# Patient Record
Sex: Female | Born: 1965 | Hispanic: No | Marital: Married | Smoking: Former smoker
Health system: Southern US, Community
[De-identification: ages and names within clinical notes are randomized; demographics above are authoritative.]

## PROBLEM LIST (undated history)

## (undated) DIAGNOSIS — F329 Major depressive disorder, single episode, unspecified: Secondary | ICD-10-CM

## (undated) DIAGNOSIS — F419 Anxiety disorder, unspecified: Secondary | ICD-10-CM

## (undated) DIAGNOSIS — I10 Essential (primary) hypertension: Secondary | ICD-10-CM

## (undated) DIAGNOSIS — F32A Depression, unspecified: Secondary | ICD-10-CM

## (undated) DIAGNOSIS — F17200 Nicotine dependence, unspecified, uncomplicated: Secondary | ICD-10-CM

## (undated) DIAGNOSIS — R7989 Other specified abnormal findings of blood chemistry: Secondary | ICD-10-CM

## (undated) DIAGNOSIS — G4733 Obstructive sleep apnea (adult) (pediatric): Secondary | ICD-10-CM

## (undated) DIAGNOSIS — D219 Benign neoplasm of connective and other soft tissue, unspecified: Secondary | ICD-10-CM

## (undated) DIAGNOSIS — R945 Abnormal results of liver function studies: Secondary | ICD-10-CM

## (undated) HISTORY — PX: DILATION AND CURETTAGE OF UTERUS: SHX78

## (undated) HISTORY — DX: Depression, unspecified: F32.A

## (undated) HISTORY — PX: UTERINE FIBROID SURGERY: SHX826

## (undated) HISTORY — DX: Obstructive sleep apnea (adult) (pediatric): G47.33

## (undated) HISTORY — DX: Major depressive disorder, single episode, unspecified: F32.9

## (undated) HISTORY — DX: Benign neoplasm of connective and other soft tissue, unspecified: D21.9

## (undated) HISTORY — DX: Other specified abnormal findings of blood chemistry: R79.89

## (undated) HISTORY — PX: TUBAL LIGATION: SHX77

## (undated) HISTORY — DX: Abnormal results of liver function studies: R94.5

## (undated) HISTORY — DX: Essential (primary) hypertension: I10

## (undated) HISTORY — DX: Nicotine dependence, unspecified, uncomplicated: F17.200

## (undated) HISTORY — DX: Anxiety disorder, unspecified: F41.9

---

## 2007-10-02 ENCOUNTER — Other Ambulatory Visit: Admission: RE | Admit: 2007-10-02 | Discharge: 2007-10-02 | Payer: Self-pay | Admitting: Gynecology

## 2007-11-20 ENCOUNTER — Encounter: Payer: Self-pay | Admitting: Gynecology

## 2007-11-20 ENCOUNTER — Ambulatory Visit (HOSPITAL_BASED_OUTPATIENT_CLINIC_OR_DEPARTMENT_OTHER): Admission: RE | Admit: 2007-11-20 | Discharge: 2007-11-20 | Payer: Self-pay | Admitting: Gynecology

## 2009-07-26 ENCOUNTER — Other Ambulatory Visit: Admission: RE | Admit: 2009-07-26 | Discharge: 2009-07-26 | Payer: Self-pay | Admitting: Gynecology

## 2009-07-26 ENCOUNTER — Ambulatory Visit: Payer: Self-pay | Admitting: Gynecology

## 2009-07-29 ENCOUNTER — Ambulatory Visit: Payer: Self-pay | Admitting: Gynecology

## 2009-08-22 ENCOUNTER — Ambulatory Visit: Payer: Self-pay | Admitting: Gynecology

## 2009-08-23 HISTORY — PX: HYSTEROSCOPY: SHX211

## 2009-08-26 ENCOUNTER — Ambulatory Visit: Payer: Self-pay | Admitting: Gynecology

## 2009-08-26 ENCOUNTER — Ambulatory Visit (HOSPITAL_BASED_OUTPATIENT_CLINIC_OR_DEPARTMENT_OTHER): Admission: RE | Admit: 2009-08-26 | Discharge: 2009-08-26 | Payer: Self-pay | Admitting: Gynecology

## 2010-11-07 NOTE — Op Note (Signed)
NAMELACEE, GREY                 ACCOUNT NO.:  [id number]   MEDICAL RECORD NO.:  [id number]          PATIENT TYPE:  AMB   LOCATION:  [facility]                         FACILITY:  [facility]   PHYSICIAN:  Timothy P. Fontaine, M.D.DATE OF BIRTH:  09/25/1965   DATE OF PROCEDURE:  11/20/2007  DATE OF DISCHARGE:                               OPERATIVE REPORT   PREOPERATIVE DIAGNOSES:  Menorrhagia, submucous myoma.   POSTOPERATIVE DIAGNOSES:  Menorrhagia, submucous myoma.   PROCEDURES:  Hysteroscopic myomectomy.  Dilatation and curettage.   SURGEON:  Timothy P. Fontaine, M.D.   ANESTHETIC:  General with 1% lidocaine paracervical block.   COMPLICATIONS:  None.   ESTIMATED BLOOD LOSS:  Minimal.  Sorbitol discrepancy 110 mL.   SPECIMEN:  1. Endometrial curetting.  2. Submucous myoma fragments   FINDINGS:  EUA, external BUS, vagina normal.  Cervix normal with uterus  grossly normal in size, midline and mobile.  Adnexa without masses.  Hysteroscopic, large submucous myoma emanating from the posterior  lateral left endometrial cavity.  Hysteroscopy was otherwise adequate  and normal, noting right and left tubal ostia, fundus, anterior-  posterior surfaces, lower uterine segment, endocervical canal all  visualized.   PROCEDURE IN DETAIL:  The patient was taken to the operating room,  underwent general anesthesia, was placed in the dorsal lithotomy  position, received a perineal vaginal preparation with Betadine  solution.  Bladder emptied with in-and-out Foley catheterization.  EUA  performed.  The patient draped in the usual fashion.  The cervix was  visualized with a speculum.  Anterior lip grasped with single-tooth  tenaculum and a paracervical block using 1% lidocaine was placed, a  total of 10 mL.  Cervix was gently, gradually dilated to admit the  operative hysteroscope and hysteroscopy was performed with findings  noted above.  Using the right-angle resectoscopic loop, the myoma was  resected in progressive passes.  There was a small amount of myoma  within the wall of the uterus that remained and for fear of perforation  this was left in situ, although it was cauterized with hopes to destroy  any remaining myoma.  Post myomectomy, hysteroscopy showed an empty  cavity, good distention.  No evidence of perforation.  The  instruments were removed.  Tenaculum removed.  Hemostasis visualized.  The patient placed in the supine position, received Toradol  intraoperatively, was awakened without difficulty and was taken to the  recovery room in good condition, having tolerated the procedure well      Timothy P. Fontaine, M.D.  Electronically Signed     TPF/MEDQ  D:  11/20/2007  T:  11/20/2007  Job:  [id number]

## 2010-11-07 NOTE — H&P (Signed)
NAMEJONIQUE, Misty Martin                 ACCOUNT NO.:  [id number]   MEDICAL RECORD NO.:  [id number]          PATIENT TYPE:  AMB   LOCATION:  [facility]                         FACILITY:  [facility]   PHYSICIAN:  Timothy P. Fontaine, M.D.DATE OF BIRTH:  09/13/1965   DATE OF ADMISSION:  11/20/2007  DATE OF DISCHARGE:                              HISTORY & PHYSICAL   DATE/TIME OF ADMISSION:  Nov 20, 2007, at 7:30 for surgery.   CHIEF COMPLAINT:  Menorrhagia.   HISTORY OF PRESENT ILLNESS:  A 45 year old G66, P3 female status post  cesarean section, tubal sterilization, who presents complaining of  progressively worsening menorrhagia.  Sonohysterogram was performed,  which showed a submucous myoma measuring 35 x 38 mm along the posterior  left wall.  The patient is admitted at this time for hysteroscopy,  submucous myoma resection.   PAST SURGICAL HISTORY:  Includes cesarean section x 3, tubal  sterilization.   PAST MEDICAL HISTORY:  None.   CURRENT MEDICATIONS:  None.   ALLERGIES:  No medication allergies.   REVIEW OF SYSTEMS:  Noncontributory.   FAMILY HISTORY:  Noncontributory.   SOCIAL HISTORY:  Noncontributory.   ADMISSION PHYSICAL EXAMINATION:  VITAL SIGNS:  Afebrile.  Vital signs  are stable.  HEENT:  Normal.  LUNGS:  Clear.  CARDIAC:  Regular rate.  No rubs, murmurs or gallops.  ABDOMINAL:  Exam benign.  PELVIC:  External BUS, vagina normal.  Cervix normal.  Uterus grossly  normal in size, midline and mobile, nontender.  Adnexa without masses or  tenderness.   ASSESSMENT:  A 45 year old G32, P3 female, status post cesarean section  x3, tubal sterilization with progressively worsening menorrhagia with  sonohysterogram showing a submucous myoma for hysteroscopic myomectomy,  D&C.  I reviewed the proposed surgery with the patient,  the expected  intraoperative postoperative courses, instrumentation, use of the  resectoscope, D&C.  The patient understands there are no guarantees as  far as menorrhagia relief, and she understands that we may not be able  to resect all of the myoma but only that portion which is protruding  into the cavity.  There may be remaining myoma left that may be an issue  in the future.  The risks of infection, prolonged antibiotics,  hemorrhage necessitating transfusion and the risks of transfusion were  reviewed to include the risks of distending  media absorption leading to  complications such as seizures, comas, were all reviewed.  The risk of  internal organ damage, uterine perforation, bowel-bladder, ureters,  vessels and nerves  necessitating major exploratory reparative surgeries, future reparative  surgeries,  including bowel resection, ostomy formation, bladder repair,  ureteral damage all discussed, understood and accepted.  The patient's  questions were answered to her satisfaction.  She is ready to proceed  with surgery.      Timothy P. Fontaine, M.D.  Electronically Signed     TPF/MEDQ  D:  11/19/2007  T:  11/19/2007  Job:  [id number]

## 2012-04-25 ENCOUNTER — Encounter: Payer: Self-pay | Admitting: Gynecology

## 2012-04-28 ENCOUNTER — Encounter: Payer: Self-pay | Admitting: Gynecology

## 2012-04-28 ENCOUNTER — Ambulatory Visit (INDEPENDENT_AMBULATORY_CARE_PROVIDER_SITE_OTHER): Payer: BC Managed Care – PPO | Admitting: Gynecology

## 2012-04-28 VITALS — BP 120/84 | Ht 64.0 in | Wt 254.0 lb

## 2012-04-28 DIAGNOSIS — F419 Anxiety disorder, unspecified: Secondary | ICD-10-CM | POA: Insufficient documentation

## 2012-04-28 DIAGNOSIS — Z01419 Encounter for gynecological examination (general) (routine) without abnormal findings: Secondary | ICD-10-CM

## 2012-04-28 DIAGNOSIS — F329 Major depressive disorder, single episode, unspecified: Secondary | ICD-10-CM | POA: Insufficient documentation

## 2012-04-28 NOTE — Patient Instructions (Signed)
Follow up in one year for annual exam. Follow up sooner if irregular menses

## 2012-04-28 NOTE — Progress Notes (Signed)
Misty Martin December 10, 1965 [id number]        46 y.o.  [id number] for annual exam.    Past medical history,surgical history, medications, allergies, family history and social history were all reviewed and documented in the EPIC chart. ROS:  Was performed and pertinent positives and negatives are included in the history.  Exam: Kim assistant Filed Vitals:   04/28/12 1523  BP: 120/84  Height: 5\' 4"  (1.626 m)  Weight: 254 lb (115.214 kg)   General appearance  Normal Skin grossly normal Head/Neck normal with no cervical or supraclavicular adenopathy thyroid normal Lungs  clear Cardiac RR, without RMG Abdominal  soft, nontender, without masses, organomegaly or hernia Breasts  examined lying and sitting without masses, retractions, discharge or axillary adenopathy. Pelvic  Ext/BUS/vagina  normal   Cervix  normal   Uterus  anteverted, normal size, shape and contour, midline and mobile nontender   Adnexa  Without masses or tenderness    Anus and perineum  normal   Rectovaginal  normal sphincter tone without palpated masses or tenderness.    Assessment/Plan:  46 y.o. [id number] female for annual exam, status post BTL.   1. Mild menstrual irregularity. Patient notes the last several months that her periods have been a little irregular where she will skip a month and then have a regular period. Has been going through a lot of stress. Does have a history of hysteroscopic myomectomy 08/2009. No other changes to include skin hair or weight nipple discharge. Will keep menstrual calendar as long as relatively regular monthly your occasional skips them we'll monitor. If prolonged period typical bleeding or has more than one month without a menses she knows to call. 2. Pap smear. No Pap smear done today. Last Pap smear 2011. No history of significant abnormalities. We'll plan every 3-5 year Pap smears. 3. Mammography. Patient had within this past year. Will repeat this coming spring.  SBE monthly  reviewed. 4. Health maintenance. No lab work done as it is all done through her primary physician's office. Follow up one year, sooner as needed.    Dara Lords MD, 4:14 PM 04/28/2012

## 2012-04-29 ENCOUNTER — Encounter: Payer: Self-pay | Admitting: Gynecology

## 2012-08-27 ENCOUNTER — Encounter: Payer: Self-pay | Admitting: Gynecology

## 2013-03-13 ENCOUNTER — Encounter: Payer: Self-pay | Admitting: Neurology

## 2013-03-13 ENCOUNTER — Ambulatory Visit (INDEPENDENT_AMBULATORY_CARE_PROVIDER_SITE_OTHER): Payer: BC Managed Care – PPO | Admitting: Neurology

## 2013-03-13 VITALS — BP 129/82 | HR 64 | Ht 64.0 in | Wt 254.0 lb

## 2013-03-13 DIAGNOSIS — R0609 Other forms of dyspnea: Secondary | ICD-10-CM

## 2013-03-13 DIAGNOSIS — R0683 Snoring: Secondary | ICD-10-CM | POA: Insufficient documentation

## 2013-03-13 DIAGNOSIS — G4733 Obstructive sleep apnea (adult) (pediatric): Secondary | ICD-10-CM

## 2013-03-13 NOTE — Progress Notes (Signed)
[facility]  Provider:  Melvyn Novas, M D  Referring Provider: Kari Baars, MD Primary Care Physician:  Misty Baars, MD  Chief Complaint  Patient presents with  . New Evaluation    OSA,Shaw, rm 11    HPI:  Misty Martin is a 47 y.o. female  Is seen here as a referral/ revisit  from Dr. Clelia Croft for a sleep consult.   Misty Martin, a 47 year old Caucasian right handed  female patient, is  seen here today to be reevaluated for possible sleep disordered breathing and  treatment. She had been diagnosed with obstructive sleep apnea about 6 years ago and used CPAP ,with a FFM for about one year, before she stopped using it. The study was performed at [facility] sleep,  Air leaks were irritating her eyes, and she developed a dry cornea with abrasion.   The patient just recently implemented new  sleep routines, she now goes to bed between 8 and 9 PM, it may take up to 30 minutes to fall asleep.  She tends to wake up at 4:00 AM before she has to work. She will have only one bathroom break on average, and she will have at least for 5 uninterrupted hours of sleep. She makes an effort to sleep at least 7-8 hours at night,  does not nap in daytime at all. She has never been " a napper", but her husband has over the last 6 month  observed again loud snores (very loudly) and he has also witnessed some apneas , which  concerned him. The patient reports her husband recently started a daytime job and until July 2013  had been napping in daytime, watching TV at night, eating and drinking both. Now for about a year she has implemented fairly normal sleep habits and has obtained the same bedtimes as his wife. Both sleep better.   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   The patient has gained weight since her last sleep study and has stopped smoking  In January after 38 years. She used to some 1.5 packs a day.  She is exercising  , participates in weight watcher's, stopped drinking alcohol.  She wakes with a dry, parched mouth , fatigued and sleepy.   She felt in retrospective better  when using CPAP 5 years ago,  better rested, less EDS.    Review of Systems: Out of a complete 14 system review, the patient complains of only the following symptoms, and all other reviewed systems are negative. The patient endorsed the Epworth Sleepiness Scale at 7 points, the fatigue severity scale of 28 points, anxiety, obesity,  Ankle edema.   History   Social History  . Marital Status: Married    Spouse Name: Misty Martin    Number of Children: 3  . Years of Education: 2.5 yr col   Occupational History  .  [employer]   Social History Main Topics  . Smoking status: Former Smoker -- 0.50 packs/day    Types: Cigarettes  . Smokeless tobacco: Never Used     Comment: quit 06/2012  . Alcohol Use: 3.0 oz/week    6 drink(s) per week     Comment: one six pack two days weekly  . Drug Use: No  . Sexual Activity: Yes    Birth Control/ Protection: Surgical   Other Topics Concern  . Not on file   Social History Narrative  .  No narrative on file    Family History  Problem Relation Age of Onset  . Heart disease Father   . Alcohol abuse Father   . CVA Father   . Stroke Father   . Uterine cancer Mother   . Deep vein thrombosis Mother   . Hypertension Mother   . Cancer Mother     UTERINE  . Cancer Son     Brain tumor  . Addison's disease Maternal Grandmother   . Alzheimer's disease Maternal Grandmother   . Addison's disease Paternal Grandmother   . Alzheimer's disease Paternal Grandmother   . COPD Paternal Grandfather     Past Medical History  Diagnosis Date  . Fibroid   . Anxiety   . Depression   . OSA (obstructive sleep apnea)     PREVIOUSLY  ON CPAP  . LFT elevation   . Nicotine addiction     quit 2014    Past Surgical History  Procedure Laterality Date  . Cesarean section      x3  . Tubal  ligation    . Dilation and curettage of uterus    . Hysteroscopy  08/2009    Myomectomy  . Uterine fibroid surgery  3/11,3/08    X2    Current Outpatient Prescriptions  Medication Sig Dispense Refill  . Cetirizine HCl (ZYRTEC ALLERGY PO) Take by mouth.      . FIBER PO Take by mouth. 2-3 times daily      . Multiple Vitamin (MULTIVITAMIN) tablet Take 1 tablet by mouth daily.       No current facility-administered medications for this visit.    Allergies as of 03/13/2013 - Review Complete 03/13/2013  Allergen Reaction Noted  . Pollen extract  03/13/2013    Vitals: BP 129/82  Pulse 64  Ht 5\' 4"  (1.626 m)  Wt 254 lb (115.214 kg)  BMI 43.58 kg/m2 Last Weight:  Wt Readings from Last 1 Encounters:  03/13/13 254 lb (115.214 kg)   Last Height:   Ht Readings from Last 1 Encounters:  03/13/13 5\' 4"  (1.626 m)    Physical exam:  General: The patient is awake, alert and appears not in acute distress. The patient is well groomed. Head: Normocephalic, atraumatic. Neck is supple. Mallampatti 3  neck circumference: 16 , no retrognathia, no TMJ, tender , tense paraspinal muscles cervically.  Cardiovascular:  Regular rate and rhythm , without  murmurs or carotid bruit, and without distended neck veins. Respiratory: Lungs are clear to auscultation. Skin:  With evidence of  Ankle-edema, no  Rash, tattooes.  Trunk: BMI iselevated , the patient  has normal posture.  Neurologic exam : The patient is awake and alert, oriented to place and time.  Memory subjective  described as intact.  There is a normal attention span & concentration ability. Speech is fluent without  dysarthria, dysphonia or aphasia. Mood and affect are appropriate.  Cranial nerves: Pupils are equal and briskly reactive to light. Funduscopic exam without  evidence of pallor or edema. Extraocular movements  in vertical and horizontal planes intact and without nystagmus. Visual fields by finger perimetry are intact. Hearing to  finger rub intact.  Facial sensation intact to fine touch. Facial motor strength is symmetric and tongue and uvula move midline.  Motor exam:  Normal tone and normal muscle bulk and symmetric normal strength in all extremities.  Sensory:  Fine touch, pinprick and vibration were tested in all extremities. Proprioception is  normal.  Coordination: Rapid alternating movements in the  fingers/hands is tested and normal. Finger-to-nose maneuver tested and normal without evidence of ataxia, dysmetria or tremor.  Gait and station: Patient walks without assistive device , wide based - Strength within normal limits. Stance is stable and normal. Tandem gait is normal.  Deep tendon reflexes: in the  upper and lower extremities are symmetric and intact. Babinski maneuver response is  downgoing.   Assessment:  After physical and neurologic examination, review of laboratory studies and pre-existing records, assessment is that of a patient with morbid obesity , tension Cervicalgia,  Witnessed snoring and apneas.  Previously diagnosed with  OSA .  Non compliant with CPAP after eye irritation, [facility] was DME at the time, she had poor follow up.    Plan:  Treatment plan and additional workup :  SPLIT study . HTN patient , not diabetic, morbidly obese.   long history of smoking, but not longer smoking.  Co2 needed, AHI of 15 and  Score at 3%,  She has still a CPAP machine , that may be able to be reset. No FFM,  please.  Diet and exercise regimen discussed , main risk factor of  for OSA is her BMI.   Visit in sleep clinic for 50 minutes with education and information about OSA , risk factors and CAD/CVA and sleepiness consequences .

## 2013-03-13 NOTE — Patient Instructions (Signed)
Sleep Apnea  Sleep apnea is a sleep disorder characterized by abnormal pauses in breathing while you sleep. When your breathing pauses, the level of oxygen in your blood decreases. This causes you to move out of deep sleep and into light sleep. As a result, your quality of sleep is poor, and the system that carries your blood throughout your body (cardiovascular system) experiences stress. If sleep apnea remains untreated, the following conditions can develop:  High blood pressure (hypertension).  Coronary artery disease.  Inability to achieve or maintain an erection (impotence).  Impairment of your thought process (cognitive dysfunction). There are three types of sleep apnea: 1. Obstructive sleep apnea Pauses in breathing during sleep because of a blocked airway. 2. Central sleep apnea Pauses in breathing during sleep because the area of the brain that controls your breathing does not send the correct signals to the muscles that control breathing. 3. Mixed sleep apnea A combination of both obstructive and central sleep apnea. RISK FACTORS The following risk factors can increase your risk of developing sleep apnea:  Being overweight.  Smoking.  Having narrow passages in your nose and throat.  Being of older age.  Being female.  Alcohol use.  Sedative and tranquilizer use.  Ethnicity. Among individuals younger than 35 years, African Americans are at increased risk of sleep apnea. SYMPTOMS   Difficulty staying asleep.  Daytime sleepiness and fatigue.  Loss of energy.  Irritability.  Loud, heavy snoring.  Morning headaches.  Trouble concentrating.  Forgetfulness.  Decreased interest in sex. DIAGNOSIS  In order to diagnose sleep apnea, your caregiver will perform a physical examination. Your caregiver may suggest that you take a home sleep test. Your caregiver may also recommend that you spend the night in a sleep lab. In the sleep lab, several monitors record  information about your heart, lungs, and brain while you sleep. Your leg and arm movements and blood oxygen level are also recorded. TREATMENT The following actions may help to resolve mild sleep apnea:  Sleeping on your side.   Using a decongestant if you have nasal congestion.   Avoiding the use of depressants, including alcohol, sedatives, and narcotics.   Losing weight and modifying your diet if you are overweight. There also are devices and treatments to help open your airway:  Oral appliances. These are custom-made mouthpieces that shift your lower jaw forward and slightly open your bite. This opens your airway.  Devices that create positive airway pressure. This positive pressure "splints" your airway open to help you breathe better during sleep. The following devices create positive airway pressure:  Continuous positive airway pressure (CPAP) device. The CPAP device creates a continuous level of air pressure with an air pump. The air is delivered to your airway through a mask while you sleep. This continuous pressure keeps your airway open.  Nasal expiratory positive airway pressure (EPAP) device. The EPAP device creates positive air pressure as you exhale. The device consists of single-use valves, which are inserted into each nostril and held in place by adhesive. The valves create very little resistance when you inhale but create much more resistance when you exhale. That increased resistance creates the positive airway pressure. This positive pressure while you exhale keeps your airway open, making it easier to breath when you inhale again.  Bilevel positive airway pressure (BPAP) device. The BPAP device is used mainly in patients with central sleep apnea. This device is similar to the CPAP device because it also uses an air pump to deliver  continuous air pressure through a mask. However, with the BPAP machine, the pressure is set at two different levels. The pressure when you  exhale is lower than the pressure when you inhale.  Surgery. Typically, surgery is only done if you cannot comply with less invasive treatments or if the less invasive treatments do not improve your condition. Surgery involves removing excess tissue in your airway to create a wider passage way. Document Released: 06/01/2002 Document Revised: 12/11/2011 Document Reviewed: 10/18/2011 [facility] Patient Information 2014 [place], [place]. Chronic Obstructive Pulmonary Disease Chronic obstructive pulmonary disease (COPD) is a lung disease. The lungs become damaged. This makes it hard to get air in and out of your lungs. The damage to your lungs cannot be changed. There are things you can do to improve the lungs and make you feel better. HOME CARE  Take all medicines as told by your doctor.  Use medicines that you breathe in (inhale) as told by your doctor.  Avoid medicines or cough syrups that dry up your airway (antihistamines) and do not allow you to get rid of thick spit.  If you smoke, stop.  Avoid being around smoke, chemicals, and fumes that bother your breathing.  Avoid people that have a catchy (contagious) sickness.  Avoid going outside when it is very hot, cold, or humid.  Use humidifiers in your home and near your bedside if it helps your breathing.  Drink enough fluids to keep your pee (urine) clear or pale yellow.  Eat healthy foods. Eat smaller meals more often and rest before meals.  Ask your doctor if it is okay to take vitamins or pills with minerals (supplements).  Exercise and stay active.  Rest with activity.  Get into a comfortable position when you have trouble breathing.  Learn and use tips on how to relax.  Learn and use tips on how to control your breathing as told by your doctor. Try:  Breathing in through your nose for 1 second. Then, breath out (exhale) through your puckered (like a whistle) lips for 2 seconds.  Putting one hand on your belly (abdomen).  Breathe in slowly through your nose. Your hand on your belly should move out. Breathe out slowly through your puckered lips. Your hand on your belly should move in as you breathe out.  Learn and use controlled coughing to clear thick spit from your lungs. 1. Lean your head slightly forward. 2. Breathe in deeply. 3. Try to hold your breath for 3 seconds. 4. Keep your mouth slightly open while coughing 2 times. 5. Spit any thick spit out into a tissue. 6. Rest and do the steps again 1 or 2 times as needed.  Get all shots (vaccines) a told by your doctor.  Learn how to manage stress.  Schedule and go to all follow-up doctor visits.  Go to therapy that can help you improve your lungs (pulmonary rehabilitation) as told by your doctor.  Use oxygen at home as told by your doctor. GET HELP RIGHT AWAY IF:   You can feel your heart beating really fast.  You have shortness of breath while resting.  You have shortness of breath that stops you from being able to talk.  You have shortness of breath that stops you from doing normal activities.  You have chest pain lasting longer than 5 minutes.  You start to shake uncontrollably (seizure).  Your family or friends notice that you are flustered or confused.  You cough up more thick spit than usual.  There is a change in the color or thickness of the spit.  Breathing is more difficult than usual.  Your breathing is faster than usual.  Your skin color is more blueish than usual.  You are running out of the medicine you take for breathing.  You are anxious, uneasy, fearful, or restless.  You have a fever. MAKE SURE YOU:   Understand these instructions.  Will watch your condition.  Will get help right away if you are not doing well or get worse. Document Released: 11/28/2007 Document Revised: 05/28/2012 Document Reviewed: 08/11/2010 [facility] Patient Information 2014 [place], [place]. Exercise to Lose Weight Exercise and a  healthy diet may help you lose weight. Your doctor may suggest specific exercises. EXERCISE IDEAS AND TIPS  Choose low-cost things you enjoy doing, such as walking, bicycling, or exercising to workout videos.  Take stairs instead of the elevator.  Walk during your lunch break.  Park your car further away from work or school.  Go to a gym or an exercise class.  Start with 5 to 10 minutes of exercise each day. Build up to 30 minutes of exercise 4 to 6 days a week.  Wear shoes with good support and comfortable clothes.  Stretch before and after working out.  Work out until you breathe harder and your heart beats faster.  Drink extra water when you exercise.  Do not do so much that you hurt yourself, feel dizzy, or get very short of breath. Exercises that burn about 150 calories:  Running 1  miles in 15 minutes.  Playing volleyball for 45 to 60 minutes.  Washing and waxing a car for 45 to 60 minutes.  Playing touch football for 45 minutes.  Walking 1  miles in 35 minutes.  Pushing a stroller 1  miles in 30 minutes.  Playing basketball for 30 minutes.  Raking leaves for 30 minutes.  Bicycling 5 miles in 30 minutes.  Walking 2 miles in 30 minutes.  Dancing for 30 minutes.  Shoveling snow for 15 minutes.  Swimming laps for 20 minutes.  Walking up stairs for 15 minutes.  Bicycling 4 miles in 15 minutes.  Gardening for 30 to 45 minutes.  Jumping rope for 15 minutes.  Washing windows or floors for 45 to 60 minutes. Document Released: 07/14/2010 Document Revised: 09/03/2011 Document Reviewed: 07/14/2010 [facility] Patient Information 2014 [place], [place].

## 2013-05-01 ENCOUNTER — Encounter (INDEPENDENT_AMBULATORY_CARE_PROVIDER_SITE_OTHER): Payer: Self-pay

## 2013-05-01 DIAGNOSIS — R0683 Snoring: Secondary | ICD-10-CM

## 2013-05-01 DIAGNOSIS — Z0289 Encounter for other administrative examinations: Secondary | ICD-10-CM

## 2013-05-01 DIAGNOSIS — G4733 Obstructive sleep apnea (adult) (pediatric): Secondary | ICD-10-CM

## 2014-04-26 ENCOUNTER — Encounter: Payer: Self-pay | Admitting: Neurology

## 2015-05-24 ENCOUNTER — Encounter (HOSPITAL_COMMUNITY): Payer: Self-pay | Admitting: *Deleted

## 2015-05-24 DIAGNOSIS — R0602 Shortness of breath: Secondary | ICD-10-CM | POA: Diagnosis not present

## 2015-05-24 DIAGNOSIS — F419 Anxiety disorder, unspecified: Secondary | ICD-10-CM | POA: Insufficient documentation

## 2015-05-24 DIAGNOSIS — R030 Elevated blood-pressure reading, without diagnosis of hypertension: Secondary | ICD-10-CM | POA: Diagnosis not present

## 2015-05-24 DIAGNOSIS — F329 Major depressive disorder, single episode, unspecified: Secondary | ICD-10-CM | POA: Insufficient documentation

## 2015-05-24 DIAGNOSIS — R0789 Other chest pain: Principal | ICD-10-CM | POA: Insufficient documentation

## 2015-05-24 DIAGNOSIS — F17210 Nicotine dependence, cigarettes, uncomplicated: Secondary | ICD-10-CM | POA: Insufficient documentation

## 2015-05-24 DIAGNOSIS — G4733 Obstructive sleep apnea (adult) (pediatric): Secondary | ICD-10-CM | POA: Insufficient documentation

## 2015-05-24 DIAGNOSIS — Z9119 Patient's noncompliance with other medical treatment and regimen: Secondary | ICD-10-CM | POA: Insufficient documentation

## 2015-05-24 DIAGNOSIS — Z6841 Body Mass Index (BMI) 40.0 and over, adult: Secondary | ICD-10-CM | POA: Insufficient documentation

## 2015-05-24 DIAGNOSIS — R079 Chest pain, unspecified: Secondary | ICD-10-CM | POA: Diagnosis present

## 2015-05-24 LAB — BASIC METABOLIC PANEL
ANION GAP: 7 (ref 5–15)
BUN: 8 mg/dL (ref 6–20)
CHLORIDE: 103 mmol/L (ref 101–111)
CO2: 31 mmol/L (ref 22–32)
CREATININE: 0.71 mg/dL (ref 0.44–1.00)
Calcium: 9.4 mg/dL (ref 8.9–10.3)
GFR calc non Af Amer: >60 mL/min (ref >60)
Glucose, Bld: 110 mg/dL — ABNORMAL HIGH (ref 65–99)
POTASSIUM: 4.6 mmol/L (ref 3.5–5.1)
SODIUM: 141 mmol/L (ref 135–145)

## 2015-05-24 LAB — CBC
HEMATOCRIT: 44.1 % (ref 36.0–46.0)
HEMOGLOBIN: 14.7 g/dL (ref 12.0–15.0)
MCH: 30.8 pg (ref 26.0–34.0)
MCHC: 33.3 g/dL (ref 30.0–36.0)
MCV: 92.3 fL (ref 78.0–100.0)
PLATELETS: 224 10*3/uL (ref 150–400)
RBC: 4.78 MIL/uL (ref 3.87–5.11)
RDW: 12.8 % (ref 11.5–15.5)
WBC: 7.1 10*3/uL (ref 4.0–10.5)

## 2015-05-24 LAB — I-STAT TROPONIN, ED: Troponin i, poc: 0 ng/mL (ref 0.00–0.08)

## 2015-05-24 NOTE — ED Notes (Signed)
Pt in stating she hasn't been feeling right for the last few days, generalized fatigue, went to an urgent care for a head cold and her BP was elevated, she has been monitoring it at home and it remains elevated, reports headache today

## 2015-05-25 ENCOUNTER — Observation Stay (HOSPITAL_COMMUNITY): Payer: Managed Care, Other (non HMO)

## 2015-05-25 ENCOUNTER — Encounter (HOSPITAL_COMMUNITY): Payer: Managed Care, Other (non HMO)

## 2015-05-25 ENCOUNTER — Emergency Department (HOSPITAL_COMMUNITY): Payer: Managed Care, Other (non HMO)

## 2015-05-25 ENCOUNTER — Observation Stay (HOSPITAL_BASED_OUTPATIENT_CLINIC_OR_DEPARTMENT_OTHER): Payer: Managed Care, Other (non HMO)

## 2015-05-25 ENCOUNTER — Observation Stay (HOSPITAL_COMMUNITY)
Admission: EM | Admit: 2015-05-25 | Discharge: 2015-05-25 | Disposition: A | Discharge status: Home / Self Care | Payer: Managed Care, Other (non HMO) | Attending: Internal Medicine | Admitting: Internal Medicine

## 2015-05-25 ENCOUNTER — Encounter (HOSPITAL_COMMUNITY): Payer: Self-pay | Admitting: Internal Medicine

## 2015-05-25 DIAGNOSIS — IMO0001 Reserved for inherently not codable concepts without codable children: Secondary | ICD-10-CM | POA: Diagnosis present

## 2015-05-25 DIAGNOSIS — R079 Chest pain, unspecified: Secondary | ICD-10-CM

## 2015-05-25 DIAGNOSIS — R030 Elevated blood-pressure reading, without diagnosis of hypertension: Secondary | ICD-10-CM

## 2015-05-25 LAB — NM MYOCAR MULTI W/SPECT W/WALL MOTION / EF
CHL CUP NUCLEAR SDS: 1
CHL CUP NUCLEAR SRS: 1
CHL CUP RESTING HR STRESS: 62 {beats}/min
CSEPED: 0 min
CSEPEDS: 0 s
Estimated workload: 1 METS
LV dias vol: 110 mL
LV sys vol: 46 mL
MPHR: 169 {beats}/min
NUC STRESS TID: 1.33
Peak HR: 88 {beats}/min
Percent HR: 52 %
RATE: 0.13
SSS: 2

## 2015-05-25 LAB — TROPONIN I
Troponin I: <0.03 ng/mL (ref <0.031)
Troponin I: <0.03 ng/mL (ref <0.031)
Troponin I: <0.03 ng/mL (ref <0.031)

## 2015-05-25 MED ORDER — MORPHINE SULFATE (PF) 2 MG/ML IV SOLN: 2.0000 mg | INTRAVENOUS | Status: DC | PRN

## 2015-05-25 MED ORDER — ASPIRIN 325 MG PO TABS
325.0000 mg | ORAL_TABLET | Freq: Once | ORAL | Status: AC
  Administered 2015-05-25: 325 mg via ORAL
  Filled 2015-05-25: qty 1

## 2015-05-25 MED ORDER — REGADENOSON 0.4 MG/5ML IV SOLN
0.4000 mg | Freq: Once | INTRAVENOUS | Status: AC
  Administered 2015-05-25: 0.4 mg via INTRAVENOUS
  Filled 2015-05-25: qty 5

## 2015-05-25 MED ORDER — NITROGLYCERIN 0.4 MG SL SUBL
0.4000 mg | SUBLINGUAL_TABLET | SUBLINGUAL | Status: DC | PRN
  Administered 2015-05-25: 0.4 mg via SUBLINGUAL
  Filled 2015-05-25: qty 1

## 2015-05-25 MED ORDER — ENOXAPARIN SODIUM 40 MG/0.4ML ~~LOC~~ SOLN
40.0000 mg | Freq: Every day | SUBCUTANEOUS | Status: DC
  Administered 2015-05-25: 40 mg via SUBCUTANEOUS
  Filled 2015-05-25: qty 0.4

## 2015-05-25 MED ORDER — HYDRALAZINE HCL 20 MG/ML IJ SOLN: 10.0000 mg | INTRAMUSCULAR | Status: DC | PRN

## 2015-05-25 MED ORDER — ACETAMINOPHEN 325 MG PO TABS: 650.0000 mg | ORAL_TABLET | ORAL | Status: DC | PRN

## 2015-05-25 MED ORDER — ASPIRIN EC 325 MG PO TBEC
325.0000 mg | DELAYED_RELEASE_TABLET | Freq: Every day | ORAL | Status: DC
  Administered 2015-05-25: 325 mg via ORAL
  Filled 2015-05-25: qty 1

## 2015-05-25 MED ORDER — ASPIRIN 325 MG PO TBEC: 325.0000 mg | DELAYED_RELEASE_TABLET | Freq: Every day | ORAL | Status: DC

## 2015-05-25 MED ORDER — LISINOPRIL 10 MG PO TABS
10.0000 mg | ORAL_TABLET | Freq: Every day | ORAL | Status: DC
  Administered 2015-05-25: 10 mg via ORAL
  Filled 2015-05-25: qty 1

## 2015-05-25 MED ORDER — REGADENOSON 0.4 MG/5ML IV SOLN
INTRAVENOUS | Status: AC
  Filled 2015-05-25: qty 5

## 2015-05-25 MED ORDER — LISINOPRIL 10 MG PO TABS: 10.0000 mg | ORAL_TABLET | Freq: Every day | ORAL | Status: DC

## 2015-05-25 MED ORDER — ONDANSETRON HCL 4 MG/2ML IJ SOLN
4.0000 mg | Freq: Four times a day (QID) | INTRAMUSCULAR | Status: DC | PRN
Start: 2015-05-25 — End: 2015-05-25

## 2015-05-25 NOTE — H&P (Signed)
[facility] History and Physical  OMARIANA Martin [id number] DOB: [birth year] DOA: 05/25/2015  Referring physician: Dr. Claudine Mouton. PCP: Marton Redwood, MD  Specialists: None.  Chief Complaint: Elevated blood pressure and chest pressure.  HPI: Misty Martin is a 49 y.o. female with history of OSA noncompliant with sleep apnea, tobacco abuse presents to the ER because of elevated blood pressure. Patient states that since Saturday 4 days ago patient has been having chest discomfort off and on which was present even at rest. Denies any shortness of breath productive cough fever or chills. Patient had gone to her PCP 2 days ago and was found to have elevated blood pressure and was given blood pressure monitor to be monitored home. At home patient found that her blood pressure has been constant and increasing in patient presented to the ER. In the ER patient also has some chest discomfort which was retrosternal pressure-like nonradiating. Patient's blood pressure also was found to be elevated. Chest x-ray was unremarkable. Troponins were negative. EKG was showing T-wave changes in inferior leads with nonspecific ST changes in the anterior leads. Patient was given sublingual nitroglycerin for elevated patient's chest pressure improved. Patient will be admitted for further workup for chest pressure and blood pressure management.   Review of Systems: As presented in the history of presenting illness, rest negative.  Past Medical History  Diagnosis Date  . Fibroid   . Anxiety   . Depression   . OSA (obstructive sleep apnea)     PREVIOUSLY  ON CPAP  . LFT elevation   . Nicotine addiction     quit 2014   Past Surgical History  Procedure Laterality Date  . Cesarean section      x3  . Tubal ligation    . Dilation and curettage of uterus    . Hysteroscopy  08/2009    Myomectomy  . Uterine fibroid surgery  3/11,3/08    X2   Social History:  reports that she has been smoking Cigarettes.  She has  been smoking about 0.50 packs per day. She has never used smokeless tobacco. She reports that she drinks about 3.0 oz of alcohol per week. She reports that she does not use illicit drugs. Where does patient live home. Can patient participate in ADLs? Yes.  Allergies  Allergen Reactions  . Pollen Extract     Nagging headaches,eye swelling    Family History:  Family History  Problem Relation Age of Onset  . Heart disease Father   . Alcohol abuse Father   . CVA Father   . Stroke Father   . Uterine cancer Mother   . Deep vein thrombosis Mother   . Hypertension Mother   . Cancer Mother     UTERINE  . Cancer Son     Brain tumor  . Addison's disease Maternal Grandmother   . Alzheimer's disease Maternal Grandmother   . Addison's disease Paternal Grandmother   . Alzheimer's disease Paternal Grandmother   . COPD Paternal Grandfather       Prior to Admission medications   Medication Sig Start Date End Date Taking? Authorizing Provider  Cetirizine HCl (ZYRTEC ALLERGY PO) Take 1 tablet by mouth daily as needed (allergies).    Yes Historical Provider, MD  Chlorpheniramine-APAP (CORICIDIN) 2-325 MG TABS Take 1 tablet by mouth 2 (two) times daily as needed (allergies).   Yes Historical Provider, MD  dextromethorphan-guaiFENesin (MUCINEX DM) 30-600 MG 12hr tablet Take 1 tablet by mouth 2 (two) times daily as needed  for cough.   Yes Historical Provider, MD  FIBER PO Take 2 tablets by mouth daily. 2-3 times daily   Yes Historical Provider, MD  Multiple Vitamin (MULTIVITAMIN) tablet Take 1 tablet by mouth daily.   Yes Historical Provider, MD    Physical Exam: Filed Vitals:   05/25/15 0030 05/25/15 0115 05/25/15 0130 05/25/15 0215  BP: 170/99 177/91 134/88 135/72  Pulse: 57 57 71 56  Temp:      TempSrc:      Resp: 15 13 19 17   Weight:      SpO2: 97% 99% 96% 95%     General:  Moderately built and nourished.  Eyes: Anicteric no pallor.  ENT: No discharge from the ears eyes nose and  mouth.  Neck: No mass felt. No JVD appreciated.  Cardiovascular: S1 and S2 heard.  Respiratory: No rhonchi or crepitations.  Abdomen: Soft nontender bowel sounds present.  Skin: No rash.  Musculoskeletal: No edema.  Psychiatric: Appears normal.  Neurologic: Alert awake oriented to time place and person. Moves all extremities.  Labs on Admission:  Basic Metabolic Panel:  Recent Labs Lab 05/24/15 2301  NA 141  K 4.6  CL 103  CO2 31  GLUCOSE 110*  BUN 8  CREATININE 0.71  CALCIUM 9.4   Liver Function Tests: No results for input(s): AST, ALT, ALKPHOS, BILITOT, PROT, ALBUMIN in the last 168 hours. No results for input(s): LIPASE, AMYLASE in the last 168 hours. No results for input(s): AMMONIA in the last 168 hours. CBC:  Recent Labs Lab 05/24/15 2301  WBC 7.1  HGB 14.7  HCT 44.1  MCV 92.3  PLT 224   Cardiac Enzymes: No results for input(s): CKTOTAL, CKMB, CKMBINDEX, TROPONINI in the last 168 hours.  BNP (last 3 results) No results for input(s): BNP in the last 8760 hours.  ProBNP (last 3 results) No results for input(s): PROBNP in the last 8760 hours.  CBG: No results for input(s): GLUCAP in the last 168 hours.  Radiological Exams on Admission: Dg Chest 2 View  05/25/2015  CLINICAL DATA:  Chest pain and dyspnea.  Hypertension EXAM: CHEST  2 VIEW COMPARISON:  None. FINDINGS: There is moderate hyperinflation. The lungs are clear. The pulmonary vasculature is normal. There is no pleural effusion. Hilar and mediastinal contours are unremarkable. IMPRESSION: Hyperinflation. Electronically Signed   By: Andreas Newport M.D.   On: 05/25/2015 01:16    EKG: Independently reviewed. Normal sinus rhythm with T-wave changes in the inferior leads and nonspecific ST-T is in the anterior leads.  Assessment/Plan Principal Problem:   Chest pain Active Problems:   Elevated blood pressure   1. Chest pain - given the history of ongoing tobacco abuse, elevated blood  pressure, EKG changes and response to sublingual nitroglycerin we will cycle cardiac markers to rule out ACS. Check 2-D echo. Aspirin. Keep patient nothing by mouth in anticipation of procedures. I have requested cardiology consult. 2. Elevated blood pressure - I have placed patient on when necessary IV hydralazine and patient is also on when necessary sublingual nitroglycerin. Closely monitor blood pressure trends. Patient may need definite antihypertensives if blood pressure continues to remain high. 3. Tobacco abuse - patient advised to quit smoking. 4. History of OSA noncompliant with C Pap.  I have reviewed patient's old charts and labs. I have personally reviewed patient's EKG and chest x-ray.   DVT Prophylaxis Lovenox.  Code Status: DO NOT INTUBATE.  Family Communication: Discussed with patient.  Disposition Plan: Admit for observation.  Aleeya Veitch N. [facility] Pager [phone].  If 7PM-7AM, please contact night-coverage [email or web address] Password [email or web address] 05/25/2015, 2:48 AM

## 2015-05-25 NOTE — Progress Notes (Signed)
Patient admitted after midnight- please see H&P. Suspect an outpatient stress test but will defer to cardiology CE negative Echo read pending Chest pain negative BP improved- added ACE  Eulogio Bear DO

## 2015-05-25 NOTE — ED Provider Notes (Signed)
CSN: [id number]     Arrival date & time 05/24/15  2245 History  By signing my name below, I, Misty Martin, attest that this documentation has been prepared under the direction and in the presence of Everlene Balls, MD . Electronically Signed: Evelene Martin, Scribe. 05/25/2015. 12:39 AM.  Chief Complaint  Patient presents with  . Hypertension    The history is provided by the patient. No language interpreter was used.    HPI Comments:  Misty Martin is a 49 y.o. female who presents to the Emergency Department complaining of elevated BP which she first noticed 2 days ago. She denies h/o HTN. Pt states she saw PCP on 05/23/15 for "allergy/sinus symptoms" and fatigue which she had been experiencing for a few days.  Pt had an elevated BP in office and was advised by PCP to buy BP machine. Her first reading yesterday was 156/75, subsequent readings ranged from [id number] systolic. She states she has also experience chest pressure and r arm numbness.  She has associated SOB.  She denies having these symptoms in the past.  Has never had a cardiac evaluation.  Past Medical History  Diagnosis Date  . Fibroid   . Anxiety   . Depression   . OSA (obstructive sleep apnea)     PREVIOUSLY  ON CPAP  . LFT elevation   . Nicotine addiction     quit 2014   Past Surgical History  Procedure Laterality Date  . Cesarean section      x3  . Tubal ligation    . Dilation and curettage of uterus    . Hysteroscopy  08/2009    Myomectomy  . Uterine fibroid surgery  3/11,3/08    X2   Family History  Problem Relation Age of Onset  . Heart disease Father   . Alcohol abuse Father   . CVA Father   . Stroke Father   . Uterine cancer Mother   . Deep vein thrombosis Mother   . Hypertension Mother   . Cancer Mother     UTERINE  . Cancer Son     Brain tumor  . Addison's disease Maternal Grandmother   . Alzheimer's disease Maternal Grandmother   . Addison's disease Paternal Grandmother   . Alzheimer's disease  Paternal Grandmother   . COPD Paternal Grandfather    Social History  Substance Use Topics  . Smoking status: Former Smoker -- 0.50 packs/day    Types: Cigarettes  . Smokeless tobacco: Never Used     Comment: quit 06/2012  . Alcohol Use: 3.0 oz/week    6 drink(s) per week     Comment: one six pack two days weekly   OB History    Gravida Para Term Preterm AB TAB SAB Ectopic Multiple Living   4 3 3  1     2      Review of Systems  10 systems reviewed and all are negative for acute change except as noted in the HPI.   Allergies  Pollen extract  Home Medications   Prior to Admission medications   Medication Sig Start Date End Date Taking? Authorizing Provider  Cetirizine HCl (ZYRTEC ALLERGY PO) Take by mouth.    Historical Provider, MD  FIBER PO Take by mouth. 2-3 times daily    Historical Provider, MD  Multiple Vitamin (MULTIVITAMIN) tablet Take 1 tablet by mouth daily.    Historical Provider, MD   BP 170/99 mmHg  Pulse 57  Temp(Src) 98 F (36.7 C) (Oral)  Resp 15  Wt 252 lb (114.306 kg)  SpO2 97% Physical Exam  Constitutional: She is oriented to person, place, and time. She appears well-developed and well-nourished. No distress.  HENT:  Head: Normocephalic and atraumatic.  Nose: Nose normal.  Mouth/Throat: Oropharynx is clear and moist. No oropharyngeal exudate.  Eyes: Conjunctivae and EOM are normal. Pupils are equal, round, and reactive to light. No scleral icterus.  Neck: Normal range of motion. Neck supple. No JVD present. No tracheal deviation present. No thyromegaly present.  Cardiovascular: Normal rate, regular rhythm and normal heart sounds.  Exam reveals no gallop and no friction rub.   No murmur heard. Pulmonary/Chest: Effort normal and breath sounds normal. No respiratory distress. She has no wheezes. She exhibits no tenderness.  Abdominal: Soft. Bowel sounds are normal. She exhibits no distension and no mass. There is no tenderness. There is no rebound and  no guarding.  Musculoskeletal: Normal range of motion. She exhibits no edema or tenderness.  Lymphadenopathy:    She has no cervical adenopathy.  Neurological: She is alert and oriented to person, place, and time. No cranial nerve deficit. She exhibits normal muscle tone.  Skin: Skin is warm and dry. No rash noted. No erythema. No pallor.  Nursing note and vitals reviewed.   ED Course  Procedures  DIAGNOSTIC STUDIES:  Oxygen Saturation is 97% on RA, normal by my interpretation.    COORDINATION OF CARE:  12:27 AM Will order ASA. Pt informed of plans to admit. Discussed treatment plan with pt at bedside and pt agreed to plan.  Labs Review Labs Reviewed  BASIC METABOLIC PANEL - Abnormal; Notable for the following:    Glucose, Bld 110 (*)    All other components within normal limits  CBC  I-STAT TROPOININ, ED    Imaging Review Dg Chest 2 View  05/25/2015  CLINICAL DATA:  Chest pain and dyspnea.  Hypertension EXAM: CHEST  2 VIEW COMPARISON:  None. FINDINGS: There is moderate hyperinflation. The lungs are clear. The pulmonary vasculature is normal. There is no pleural effusion. Hilar and mediastinal contours are unremarkable. IMPRESSION: Hyperinflation. Electronically Signed   By: Andreas Newport M.D.   On: 05/25/2015 01:16   I have personally reviewed and evaluated these images and lab results as part of my medical decision-making.   EKG Interpretation   Date/Time:  Tuesday May 24 2015 22:56:10 EST Ventricular Rate:  61 PR Interval:  148 QRS Duration: 82 QT Interval:  408 QTC Calculation: 410 R Axis:   68 Text Interpretation:  Normal sinus rhythm Cannot rule out Anterior infarct  , age undetermined TWI in V3 Abnormal ECG No old tracing to compare  Confirmed by Glynn Octave [id number]) on 05/25/2015 12:34:05 AM      MDM   Final diagnoses:  None   Patient presents to the emergency department for chest pressure with right arm numbness. I have concern for  possible ACS. EKG does show T-wave inversion in V3 with abnormal anterior leads. She was given nitroglycerin for her chest pressure that is ongoing and aspirin. She'll be admitted to hospitalist for further care.   I personally performed the services described in this documentation, which was scribed in my presence. The recorded information has been reviewed and is accurate.       Everlene Balls, MD 05/25/15 [phone]

## 2015-05-25 NOTE — Progress Notes (Signed)
  1-day NST completed without complications. Official read pending by [facility].  Signed, Erma Heritage, PA-C 05/25/2015, 3:41 PM Pager: [phone]

## 2015-05-25 NOTE — Progress Notes (Signed)
Patient given discharge instructions and prescriptions. Verbalized understanding of instructions with now questions or concerns. IV and telemetry removed. Patient left floor with belongings and friend.

## 2015-05-25 NOTE — Discharge Summary (Signed)
Physician Discharge Summary  JAZAYA HASSER [id number] DOB: 01/29/1966 DOA: 05/25/2015  PCP: Marton Redwood, MD  Admit date: 05/25/2015 Discharge date: 05/25/2015  Time spent: 35 minutes  Recommendations for Outpatient Follow-up:  1. BMP 2 week- started on lisinopril for HTN   Discharge Diagnoses:  Principal Problem:   Chest pain Active Problems:   Elevated blood pressure   Pain in the chest   Discharge Condition: improved  Diet recommendation: cardiac  Filed Weights   05/24/15 2251 05/25/15 0257  Weight: 114.306 kg (252 lb) 114.306 kg (252 lb)    History of present illness:  Misty Martin is a 49 y.o. female with history of OSA noncompliant with sleep apnea, tobacco abuse presents to the ER because of elevated blood pressure. Patient states that since Saturday 4 days ago patient has been having chest discomfort off and on which was present even at rest. Denies any shortness of breath productive cough fever or chills. Patient had gone to her PCP 2 days ago and was found to have elevated blood pressure and was given blood pressure monitor to be monitored home. At home patient found that her blood pressure has been constant and increasing in patient presented to the ER. In the ER patient also has some chest discomfort which was retrosternal pressure-like nonradiating. Patient's blood pressure also was found to be elevated. Chest x-ray was unremarkable. Troponins were negative. EKG was showing T-wave changes in inferior leads with nonspecific ST changes in the anterior leads. Patient was given sublingual nitroglycerin for elevated patient's chest pressure improved. Patient will be admitted for further workup for chest pressure and blood pressure management.   Hospital Course:  Chest pain -stress test negative -CE negative  HTN -started lisinopril -repeat BMP 2 weeks  Procedures:  Consultations:  cards  Discharge Exam: Filed Vitals:   05/25/15 1527 05/25/15 1529  BP:  127/90 131/90  Pulse: 82 73  Temp:    Resp:        Discharge Instructions   Discharge Instructions    Diet - low sodium heart healthy    Complete by:  As directed      Discharge instructions    Complete by:  As directed   BMP 2 weeks re: K     Increase activity slowly    Complete by:  As directed           Current Discharge Medication List    START taking these medications   Details  aspirin EC 325 MG EC tablet Take 1 tablet (325 mg total) by mouth daily. Qty: 30 tablet, Refills: 0    lisinopril (PRINIVIL,ZESTRIL) 10 MG tablet Take 1 tablet (10 mg total) by mouth daily. Qty: 30 tablet, Refills: 0      CONTINUE these medications which have NOT CHANGED   Details  Cetirizine HCl (ZYRTEC ALLERGY PO) Take 1 tablet by mouth daily as needed (allergies).     FIBER PO Take 2 tablets by mouth daily. 2-3 times daily    Multiple Vitamin (MULTIVITAMIN) tablet Take 1 tablet by mouth daily.      STOP taking these medications     Chlorpheniramine-APAP (CORICIDIN) 2-325 MG TABS      dextromethorphan-guaiFENesin (MUCINEX DM) 30-600 MG 12hr tablet        Allergies  Allergen Reactions  . Pollen Extract     Nagging headaches,eye swelling   Follow-up Information    Follow up with Marton Redwood, MD In 2 weeks.   Specialty:  Internal Medicine  Contact information:   [address] [place] [address] [phone]        The results of significant diagnostics from this hospitalization (including imaging, microbiology, ancillary and laboratory) are listed below for reference.    Significant Diagnostic Studies: Dg Chest 2 View  05/25/2015  CLINICAL DATA:  Chest pain and dyspnea.  Hypertension EXAM: CHEST  2 VIEW COMPARISON:  None. FINDINGS: There is moderate hyperinflation. The lungs are clear. The pulmonary vasculature is normal. There is no pleural effusion. Hilar and mediastinal contours are unremarkable. IMPRESSION: Hyperinflation. Electronically Signed   By:  Andreas Newport M.D.   On: 05/25/2015 01:16   Nm Myocar Multi W/spect W/wall Motion / Ef  05/25/2015   There was no ST segment deviation noted during stress.  No T wave inversion was noted during stress.  The study is normal.  This is a low risk study.  The left ventricular ejection fraction is normal (55-65%).  Normal resting and stress perfusion EF 58%    Microbiology: No results found for this or any previous visit (from the past 240 hour(s)).   Labs: Basic Metabolic Panel:  Recent Labs Lab 05/24/15 2301  NA 141  K 4.6  CL 103  CO2 31  GLUCOSE 110*  BUN 8  CREATININE 0.71  CALCIUM 9.4   Liver Function Tests: No results for input(s): AST, ALT, ALKPHOS, BILITOT, PROT, ALBUMIN in the last 168 hours. No results for input(s): LIPASE, AMYLASE in the last 168 hours. No results for input(s): AMMONIA in the last 168 hours. CBC:  Recent Labs Lab 05/24/15 2301  WBC 7.1  HGB 14.7  HCT 44.1  MCV 92.3  PLT 224   Cardiac Enzymes:  Recent Labs Lab 05/25/15 0320 05/25/15 0847  TROPONINI <0.03 <0.03   BNP: BNP (last 3 results) No results for input(s): BNP in the last 8760 hours.  ProBNP (last 3 results) No results for input(s): PROBNP in the last 8760 hours.  CBG: No results for input(s): GLUCAP in the last 168 hours.     Signed:  Kyona Chauncey Eliseo Squires  [facility] 05/25/2015, 5:32 PM

## 2015-05-25 NOTE — Consult Note (Signed)
CARDIOLOGY CONSULT NOTE   Patient ID: AMARION MCGLOTHEN MRN: [id number] DOB/AGE: 1965-10-11 49 y.o.  Admit date: 05/25/2015  Primary Physician   Marton Redwood, MD Primary Cardiologist   New (Dr. Radford Pax) Reason for Consultation   Elevated BP and chest pressure  HPI: Misty Martin is a 49 y.o. female with a history of tobacco smoking and OSA noncompliant with CPAP who came to ED last night for evaluation of elevated blood pressure.  The patient had congestion for the past 7 days. Denies fevers or chills. 4 days ago she developed a left-sided upper chest pressure that intermittently radiates to her back. The pain intermittently comes back without any lesion and just within a few minutes. No associated shortness of breath or nausea. She went to see her primary care provider two days ago where her blood pressure was elevated and was given blood pressure monitor to be monitored home. He continued to feel weak over the weekend and yesterday was worse with intermittend dizziness and left upper chest pain. She checked her blood pressure several times yesterday that was 180s to 190s range this her to ED presentation.   Upon arrival to ED her blood pressure was 202/109 with some mild chest discomfort. Both improved on SL nitro. Chest x-ray clear. Troponin x 3 negative. EKG shows normal sinus rhythm with T-wave inversion in lead 3 and nonspecific ST change in anterior leads. No prior EKG to compare. Cardiology is consulted for further management if patient requires ischemic evaluation are not. The patient denies nausea, vomiting, fever, palpitations, shortness of breath, orthopnea, PND, syncope, abdominal pain, hematochezia, melena, lower extremity edema.  Her father has a multiple stroke, first stroke in his 38s - died of heart failure. Mother has hypertension.   Past Medical History  Diagnosis Date  . Fibroid   . Anxiety   . Depression   . OSA (obstructive sleep apnea)     PREVIOUSLY  ON CPAP  . LFT  elevation   . Nicotine addiction     quit 2014     Past Surgical History  Procedure Laterality Date  . Cesarean section      x3  . Tubal ligation    . Dilation and curettage of uterus    . Hysteroscopy  08/2009    Myomectomy  . Uterine fibroid surgery  3/11,3/08    X2    Allergies  Allergen Reactions  . Pollen Extract     Nagging headaches,eye swelling    I have reviewed the patient's current medications . aspirin EC  325 mg Oral Daily  . enoxaparin (LOVENOX) injection  40 mg Subcutaneous Daily  . lisinopril  10 mg Oral Daily     acetaminophen, hydrALAZINE, morphine injection, nitroGLYCERIN, ondansetron (ZOFRAN) IV  Prior to Admission medications   Medication Sig Start Date End Date Taking? Authorizing Provider  Cetirizine HCl (ZYRTEC ALLERGY PO) Take 1 tablet by mouth daily as needed (allergies).    Yes Historical Provider, MD  Chlorpheniramine-APAP (CORICIDIN) 2-325 MG TABS Take 1 tablet by mouth 2 (two) times daily as needed (allergies).   Yes Historical Provider, MD  dextromethorphan-guaiFENesin (MUCINEX DM) 30-600 MG 12hr tablet Take 1 tablet by mouth 2 (two) times daily as needed for cough.   Yes Historical Provider, MD  FIBER PO Take 2 tablets by mouth daily. 2-3 times daily   Yes Historical Provider, MD  Multiple Vitamin (MULTIVITAMIN) tablet Take 1 tablet by mouth daily.   Yes Historical Provider, MD  Social History   Social History  . Marital Status: Married    Spouse Name: Juanda Crumble  . Number of Children: 3  . Years of Education: 2.5 yr col   Occupational History  .  [employer]   Social History Main Topics  . Smoking status: Current Every Day Smoker -- 0.50 packs/day    Types: Cigarettes  . Smokeless tobacco: Never Used     Comment: quit 06/2012  . Alcohol Use: 3.0 oz/week    6 Standard drinks or equivalent per week     Comment: one six pack two days weekly  . Drug Use: No  . Sexual Activity: Yes    Birth Control/ Protection: Surgical     Other Topics Concern  . Not on file   Social History Narrative    Family Status  Relation Status Death Age  . Father Deceased 49's    CVA  . Mother Alive   . Son Deceased 59    glioblastoma   Family History  Problem Relation Age of Onset  . Heart disease Father   . Alcohol abuse Father   . CVA Father   . Stroke Father   . Uterine cancer Mother   . Deep vein thrombosis Mother   . Hypertension Mother   . Cancer Mother     UTERINE  . Cancer Son     Brain tumor  . Addison's disease Maternal Grandmother   . Alzheimer's disease Maternal Grandmother   . Addison's disease Paternal Grandmother   . Alzheimer's disease Paternal Grandmother   . COPD Paternal Grandfather     ROS:  Full 14 point review of systems complete and found to be negative unless listed above.  Physical Exam: Blood pressure 156/63, pulse 55, temperature 97.9 F (36.6 C), temperature source Oral, resp. rate 18, height 5\' 4"  (1.626 m), weight 252 lb (114.306 kg), SpO2 96 %.  General: Well developed, well nourished, obsese female in no acute distress Head: Eyes PERRLA, No xanthomas. Normocephalic and atraumatic, oropharynx without edema or exudate.  Lungs: Resp regular and unlabored, CTA. TTP over left upper chest.  Heart: RRR no s3, s4, or murmurs..   Neck: No carotid bruits. No lymphadenopathy. No JVD. Abdomen: Bowel sounds present, abdomen soft and non-tender without masses or hernias noted. Msk:  No spine or cva tenderness. No weakness, no joint deformities or effusions. Extremities: No clubbing, cyanosis or edema. DP/PT/Radials 2+ and equal bilaterally. Neuro: Alert and oriented X 3. No focal deficits noted. Psych:  Good affect, responds appropriately Skin: No rashes or lesions noted.  Labs:   Lab Results  Component Value Date   WBC 7.1 05/24/2015   HGB 14.7 05/24/2015   HCT 44.1 05/24/2015   MCV 92.3 05/24/2015   PLT 224 05/24/2015   No results for input(s): INR in the last 72 hours.   Recent Labs Lab 05/24/15 2301  NA 141  K 4.6  CL 103  CO2 31  BUN 8  CREATININE 0.71  CALCIUM 9.4  GLUCOSE 110*   No results found for: MG  Recent Labs  05/25/15 0320 05/25/15 0847  TROPONINI <0.03 <0.03    Recent Labs  05/24/15 2330  TROPIPOC 0.00    ECG:   Vent. rate 61 BPM PR interval 148 ms QRS duration 82 ms QT/QTc 408/410 ms P-R-T axes 30 68 15  Radiology:  Dg Chest 2 View  05/25/2015  CLINICAL DATA:  Chest pain and dyspnea.  Hypertension EXAM: CHEST  2 VIEW COMPARISON:  None. FINDINGS: There is moderate hyperinflation. The lungs are clear. The pulmonary vasculature is normal. There is no pleural effusion. Hilar and mediastinal contours are unremarkable. IMPRESSION: Hyperinflation. Electronically Signed   By: Andreas Newport M.D.   On: 05/25/2015 01:16    ASSESSMENT AND PLAN:     1.Chest pain - The patient has both typical and atypical features. Left upper chest pressure with radiation to her left back in a setting of limited blood pressure, ongoing tobacco abuse and morbid obesity. Her pain was responsive to sublingual nitroglycerin. EKG with nonspecific changes. Troponin x 3 negative. X-ray clear - Will discuss plan with M.D. The patient is nothing by mouth.  2. Severely elevated blood pressure. - Upon admission blood pressure was 202/109. Improved with sublingual nitroglycerin. - Continue lisinopril 10 mg daily. Most recent blood pressure reading 156/63.  3. Tobacco abuse - Strongly encouraged smoking cessation. Education given.  4. Morbid obesity - Body mass index is 43.23 kg/(m^2).  - Encouraged lifestyle modification including daily exercise and dietary modification. She states that she has not been followed up with primary care for the past 2 years. Encouraged to establish care with PCP.  SignedLeanor Kail, [place] 05/25/2015, 11:39 AM Pager [phone]  Co-Sign MD

## 2015-05-25 NOTE — ED Notes (Signed)
Pt denies chest pain at this time.

## 2015-05-25 NOTE — ED Notes (Signed)
Patient transported to X-ray 

## 2015-05-25 NOTE — Progress Notes (Signed)
  Echocardiogram 2D Echocardiogram has been performed.  [name] 05/25/2015, 10:15 AM

## 2016-09-17 ENCOUNTER — Other Ambulatory Visit: Payer: Self-pay | Admitting: Internal Medicine

## 2016-09-17 ENCOUNTER — Telehealth (HOSPITAL_COMMUNITY): Payer: Self-pay | Admitting: Internal Medicine

## 2016-09-17 DIAGNOSIS — G453 Amaurosis fugax: Secondary | ICD-10-CM

## 2016-09-17 DIAGNOSIS — R0609 Other forms of dyspnea: Principal | ICD-10-CM

## 2016-09-17 NOTE — Telephone Encounter (Signed)
09/17/2016 10:13 AM Phone (Outgoing) Misty Martin (Self) [phone] (H)   Left Message - Called pt and lmsg for her to CB to sch an echo.     By Verdene Rio

## 2016-09-18 ENCOUNTER — Other Ambulatory Visit (HOSPITAL_COMMUNITY): Payer: Managed Care, Other (non HMO)

## 2016-09-24 ENCOUNTER — Other Ambulatory Visit: Payer: Self-pay

## 2016-09-24 ENCOUNTER — Ambulatory Visit (HOSPITAL_COMMUNITY): Payer: BLUE CROSS/BLUE SHIELD | Attending: Cardiovascular Disease

## 2016-09-24 DIAGNOSIS — G453 Amaurosis fugax: Secondary | ICD-10-CM | POA: Insufficient documentation

## 2016-09-24 DIAGNOSIS — Z87891 Personal history of nicotine dependence: Secondary | ICD-10-CM | POA: Diagnosis not present

## 2016-09-24 DIAGNOSIS — Z6841 Body Mass Index (BMI) 40.0 and over, adult: Secondary | ICD-10-CM | POA: Diagnosis not present

## 2016-09-24 DIAGNOSIS — R0609 Other forms of dyspnea: Secondary | ICD-10-CM | POA: Diagnosis present

## 2016-09-24 DIAGNOSIS — E669 Obesity, unspecified: Secondary | ICD-10-CM | POA: Insufficient documentation

## 2016-11-21 ENCOUNTER — Ambulatory Visit (INDEPENDENT_AMBULATORY_CARE_PROVIDER_SITE_OTHER): Payer: BLUE CROSS/BLUE SHIELD | Admitting: Gynecology

## 2016-11-21 ENCOUNTER — Encounter: Payer: Self-pay | Admitting: Gynecology

## 2016-11-21 VITALS — BP 122/82 | Ht 63.5 in | Wt 258.0 lb

## 2016-11-21 DIAGNOSIS — L723 Sebaceous cyst: Secondary | ICD-10-CM

## 2016-11-21 DIAGNOSIS — N951 Menopausal and female climacteric states: Secondary | ICD-10-CM

## 2016-11-21 DIAGNOSIS — Z1151 Encounter for screening for human papillomavirus (HPV): Secondary | ICD-10-CM | POA: Diagnosis not present

## 2016-11-21 DIAGNOSIS — Z01419 Encounter for gynecological examination (general) (routine) without abnormal findings: Secondary | ICD-10-CM

## 2016-11-21 NOTE — Patient Instructions (Signed)
Schedule your colonoscopy with either:  [facility]   Address: [address], [place], [place] [address]  [phone]    or  [facility]  Address: [address], [place], [place] [address]  [phone]       Call to Schedule your mammogram  Facilities in [place]: 1)  [facility]. [facility], [address] Phone: [phone] 2)  Dr. Bertrand at [facility]  [address] Phone: [phone]     Mammogram A mammogram is an X-ray test to find changes in a woman's breast. You should get a mammogram if:  You are 40 years of age or older  You have risk factors.   Your doctor recommends that you have one.  BEFORE THE TEST  Do not schedule the test the week before your period, especially if your breasts are sore during this time.  On the day of your mammogram:  Wash your breasts and armpits well. After washing, do not put on any deodorant or talcum powder on until after your test.   Eat and drink as you usually do.   Take your medicines as usual.   If you are diabetic and take insulin, make sure you:   Eat before coming for your test.   Take your insulin as usual.   If you cannot keep your appointment, call before the appointment to cancel. Schedule another appointment.  TEST  You will need to undress from the waist up. You will put on a hospital gown.   Your breast will be put on the mammogram machine, and it will press firmly on your breast with a piece of plastic called a compression paddle. This will make your breast flatter so that the machine can X-ray all parts of your breast.   Both breasts will be X-rayed. Each breast will be X-rayed from above and from the side. An X-ray might need to be taken again if the picture is not good enough.   The mammogram will last about 15 to 30 minutes.  AFTER THE TEST Finding out the results of your test Ask when  your test results will be ready. Make sure you get your test results.  Document Released: 09/07/2008 Document Revised: 05/31/2011 Document Reviewed: 09/07/2008 Exit[employer] Patient Information 2012 [employer].   

## 2016-11-21 NOTE — Progress Notes (Signed)
    Misty Martin 06-Oct-1965 [id number]        51 y.o.  [id number] for annual exam.  Has not been in the office since 2013.  Past medical history,surgical history, problem list, medications, allergies, family history and social history were all reviewed and documented as reviewed in the EPIC chart.  ROS:  Performed with pertinent positives and negatives included in the history, assessment and plan.   Additional significant findings :  None   Exam: Caryn Bee assistant Vitals:   11/21/16 1420  BP: 122/82  Weight: 258 lb (117 kg)  Height: 5' 3.5" (1.613 m)   Body mass index is 44.99 kg/m.  General appearance:  Normal affect, orientation and appearance. Skin: Grossly normal excepting 3-4 cm classic sebaceous cyst mid upper back between scapulas. HEENT: Without gross lesions.  No cervical or supraclavicular adenopathy. Thyroid normal.  Lungs:  Clear without wheezing, rales or rhonchi Cardiac: RR, without RMG Abdominal:  Soft, nontender, without masses, guarding, rebound, organomegaly or hernia Breasts:  Examined lying and sitting without masses, retractions, discharge or axillary adenopathy. Pelvic:  Ext, BUS, Vagina: Normal  Cervix: Normal  Uterus: Difficult to palpate due to abdominal girth but no gross masses or tenderness  Adnexa: Without gross masses or tenderness    Anus and perineum: Normal   Rectovaginal: Normal sphincter tone without palpated masses or tenderness.    Assessment/Plan:  51 y.o. [id number] female for annual exam, tubal sterilization.   1. Perimenopausal. Patient is having some hot flushes and sweats. Last menstrual period November 2017 with one other menses that year.  I reviewed the perimenopausal time period with the patient.  Options for treatment to include HRT reviewed. Patient not interested at this point. Will keep menstrual calendar as long as less frequent but regular menses will monitor. If prolonged or atypical bleeding or goes more than one year  without bleeding and then has bleeding she knows to call for further evaluation. 2. Pap smear 2011.  Pap smear/HPV done today. No history of significant abnormal Pap smears previously. 3. Mammography 2014. Recommend screening mammogram now. Names and numbers given and she is going to call and schedule. SBE monthly reviewed. 4. Sebaceous cyst between scapulas. Present for years by her history. Classic in appearance. We'll continue to monitor report any issues or changes. 5. Colonoscopy never. Recommended screening colonoscopy in patient agrees to schedule. Names and numbers provided. 6. Health maintenance. No routine lab work done as patient has appointment to see primary physician and plans that time. Follow up in one year, sooner as needed.   Anastasio Auerbach MD, 2:56 PM 11/21/2016

## 2016-11-21 NOTE — Addendum Note (Signed)
Addended by: Nelva Nay on: 11/21/2016 03:27 PM   Modules accepted: Orders

## 2016-11-22 LAB — PAP IG AND HPV HIGH-RISK: HPV DNA High Risk: NOT DETECTED

## 2017-04-06 IMAGING — DX DG CHEST 2V
2 series · 2 of 2 positions shown · non-contrast
Comparison: None.

CLINICAL DATA: Chest pain and dyspnea.  Hypertension

EXAM:
CHEST  2 VIEW

[chest pa]
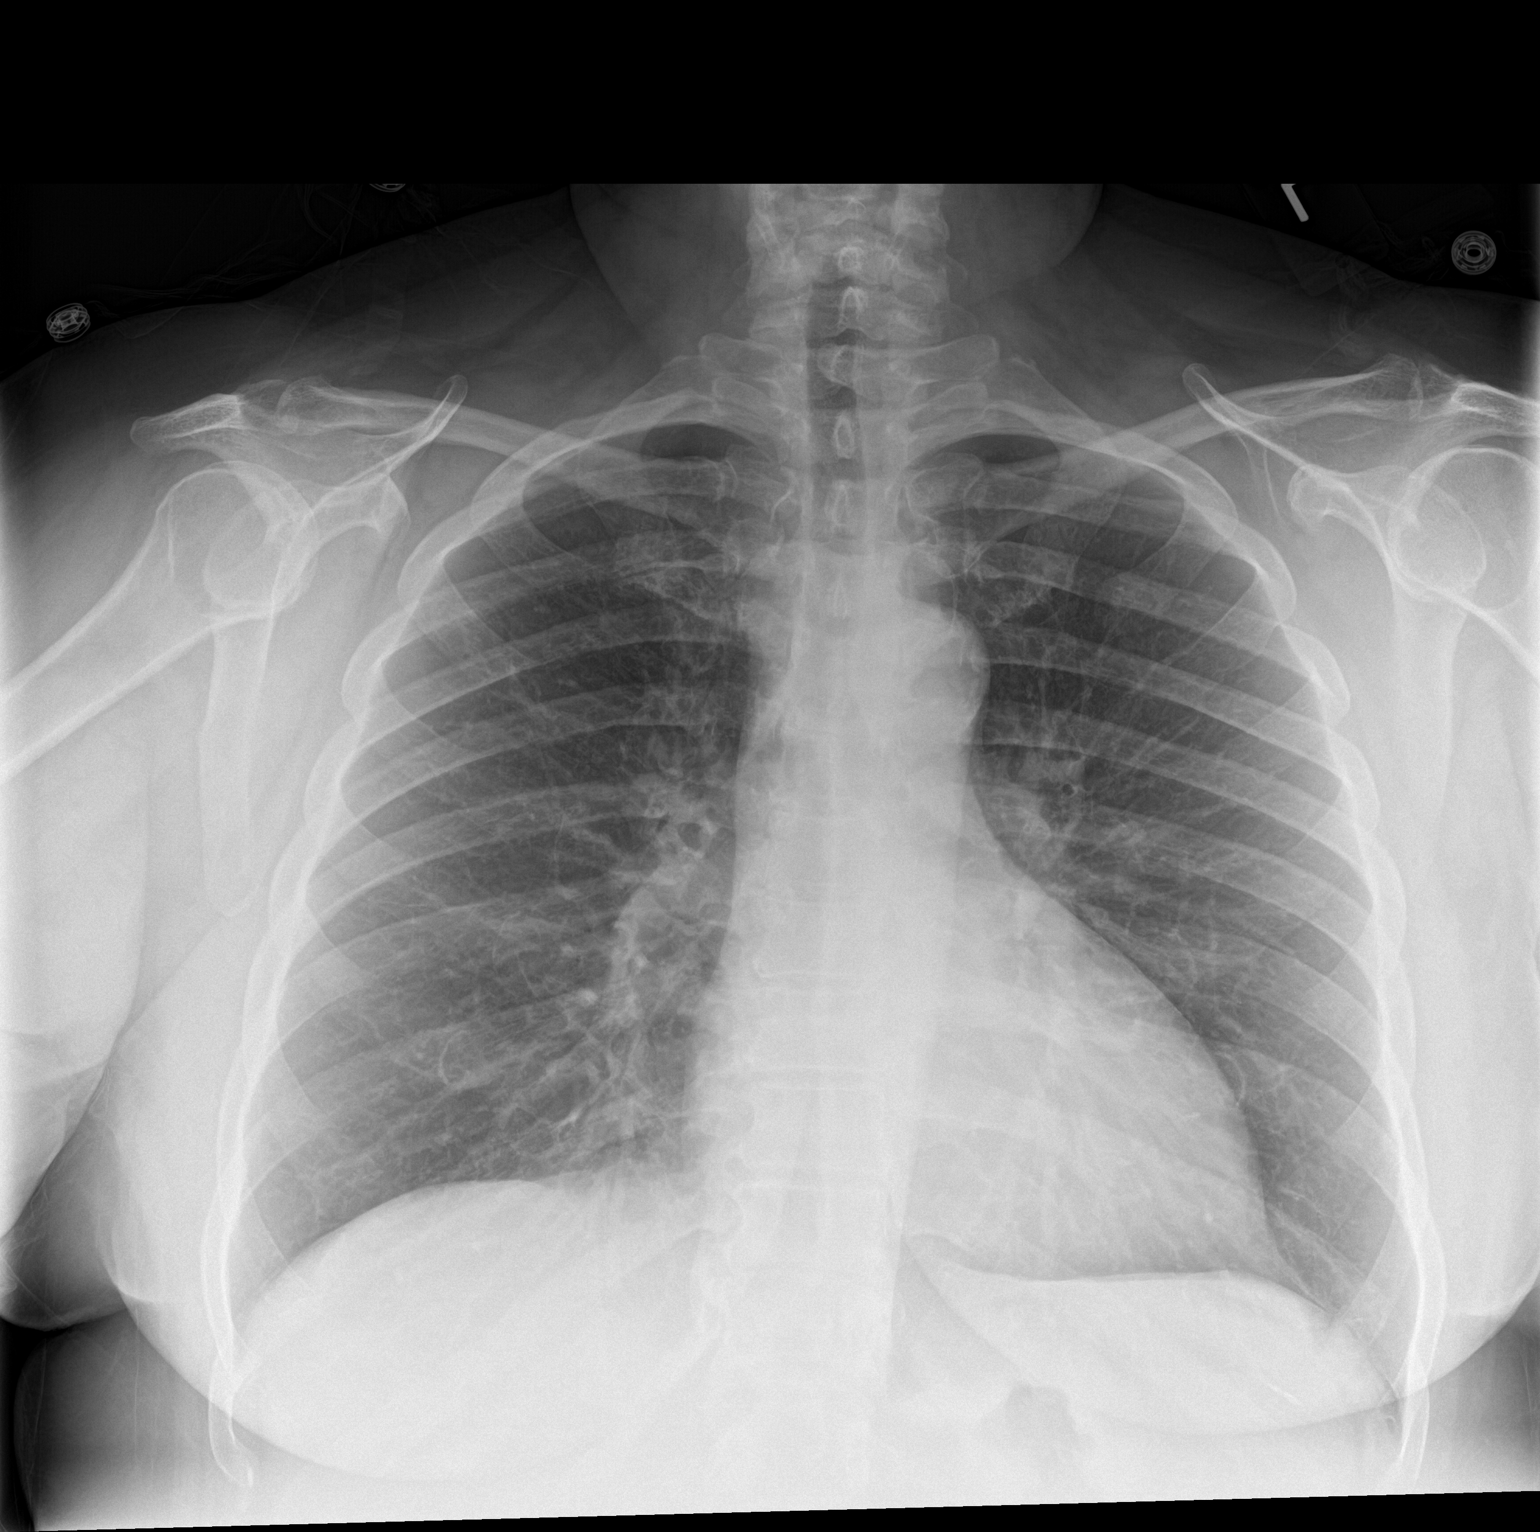

[chest lat]
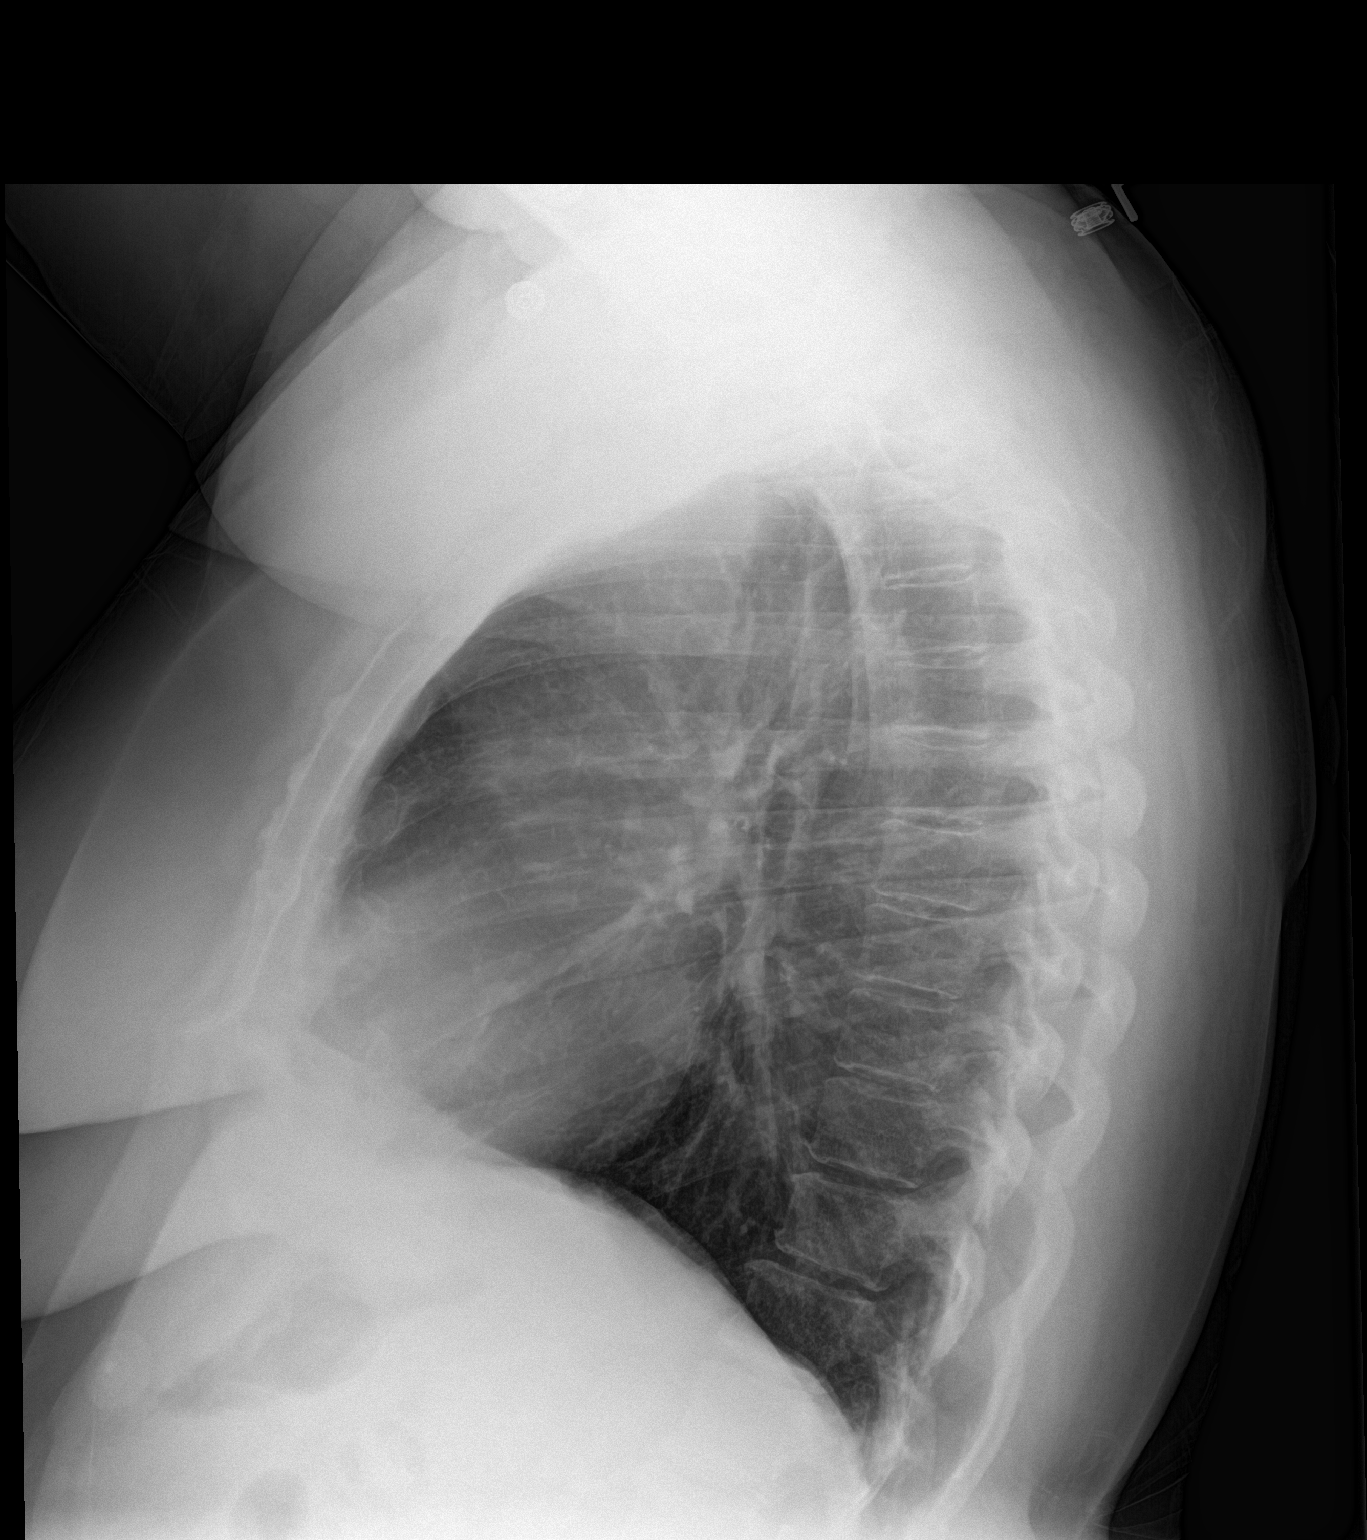

[2 of 2 positions shown; findings below may reference images not displayed]

FINDINGS: There is moderate hyperinflation. The lungs are clear. The pulmonary
vasculature is normal. There is no pleural effusion. Hilar and
mediastinal contours are unremarkable.
IMPRESSION: Hyperinflation.

## 2019-03-18 ENCOUNTER — Encounter: Payer: Self-pay | Admitting: Gynecology
